# Patient Record
Sex: Male | Born: 2000
Health system: Southern US, Community
[De-identification: ages and names within clinical notes are randomized; demographics above are authoritative.]

## PROBLEM LIST (undated history)

## (undated) DIAGNOSIS — L709 Acne, unspecified: Secondary | ICD-10-CM

---
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
## 2019-09-15 ENCOUNTER — Emergency Department (HOSPITAL_BASED_OUTPATIENT_CLINIC_OR_DEPARTMENT_OTHER)
Admission: EM | Admit: 2019-09-15 | Discharge: 2019-09-15 | Disposition: A | Discharge status: Home / Self Care | Payer: 59 | Attending: Emergency Medicine | Admitting: Emergency Medicine

## 2019-09-15 ENCOUNTER — Encounter (HOSPITAL_BASED_OUTPATIENT_CLINIC_OR_DEPARTMENT_OTHER): Payer: Self-pay | Admitting: Emergency Medicine

## 2019-09-15 ENCOUNTER — Other Ambulatory Visit: Payer: Self-pay

## 2019-09-15 DIAGNOSIS — Y92410 Unspecified street and highway as the place of occurrence of the external cause: Secondary | ICD-10-CM | POA: Insufficient documentation

## 2019-09-15 DIAGNOSIS — Y9389 Activity, other specified: Secondary | ICD-10-CM | POA: Insufficient documentation

## 2019-09-15 DIAGNOSIS — S0990XA Unspecified injury of head, initial encounter: Secondary | ICD-10-CM | POA: Diagnosis present

## 2019-09-15 DIAGNOSIS — S0101XA Laceration without foreign body of scalp, initial encounter: Secondary | ICD-10-CM

## 2019-09-15 DIAGNOSIS — Y999 Unspecified external cause status: Secondary | ICD-10-CM | POA: Insufficient documentation

## 2019-09-15 MED ORDER — KETOROLAC TROMETHAMINE 60 MG/2ML IM SOLN
60.0000 mg | Freq: Once | INTRAMUSCULAR | Status: DC
  Filled 2019-09-15: qty 2

## 2019-09-15 MED ORDER — OXYCODONE-ACETAMINOPHEN 5-325 MG PO TABS
2.0000 | ORAL_TABLET | Freq: Once | ORAL | Status: AC
  Administered 2019-09-15: 2 via ORAL
  Filled 2019-09-15: qty 2
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
## 2019-09-25 ENCOUNTER — Emergency Department (HOSPITAL_BASED_OUTPATIENT_CLINIC_OR_DEPARTMENT_OTHER)
Admission: EM | Admit: 2019-09-25 | Discharge: 2019-09-25 | Disposition: A | Discharge status: Home / Self Care | Payer: 59 | Attending: Emergency Medicine | Admitting: Emergency Medicine

## 2019-09-25 ENCOUNTER — Other Ambulatory Visit: Payer: Self-pay

## 2019-09-25 ENCOUNTER — Encounter (HOSPITAL_BASED_OUTPATIENT_CLINIC_OR_DEPARTMENT_OTHER): Payer: Self-pay | Admitting: Emergency Medicine

## 2019-09-25 DIAGNOSIS — S0101XD Laceration without foreign body of scalp, subsequent encounter: Secondary | ICD-10-CM | POA: Diagnosis not present

## 2019-09-25 DIAGNOSIS — Z4802 Encounter for removal of sutures: Secondary | ICD-10-CM | POA: Diagnosis not present

## 2019-09-25 NOTE — ED Triage Notes (Signed)
Pt arrives to ED to have staples removed, states he has had them in for 10 days.
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
